# Patient Record
Sex: Female | Born: 1970 | Race: White | Hispanic: No | Marital: Single | Smoking: Never smoker
Health system: Southern US, Community
[De-identification: ages and names within clinical notes are randomized; demographics above are authoritative.]

## PROBLEM LIST (undated history)

## (undated) DIAGNOSIS — D259 Leiomyoma of uterus, unspecified: Secondary | ICD-10-CM

## (undated) DIAGNOSIS — T7840XA Allergy, unspecified, initial encounter: Secondary | ICD-10-CM

## (undated) DIAGNOSIS — J45909 Unspecified asthma, uncomplicated: Secondary | ICD-10-CM

## (undated) HISTORY — PX: HEMORRHOID SURGERY: SHX153

## (undated) HISTORY — DX: Unspecified asthma, uncomplicated: J45.909

## (undated) HISTORY — DX: Allergy, unspecified, initial encounter: T78.40XA

## (undated) HISTORY — PX: TUBAL LIGATION: SHX77

## (undated) HISTORY — PX: EYE SURGERY: SHX253

---

## 1998-07-29 ENCOUNTER — Other Ambulatory Visit: Admission: RE | Admit: 1998-07-29 | Discharge: 1998-07-29 | Payer: Self-pay | Admitting: *Deleted

## 1999-09-30 ENCOUNTER — Other Ambulatory Visit: Admission: RE | Admit: 1999-09-30 | Discharge: 1999-09-30 | Payer: Self-pay | Admitting: *Deleted

## 1999-12-08 ENCOUNTER — Other Ambulatory Visit: Admission: RE | Admit: 1999-12-08 | Discharge: 1999-12-08 | Payer: Self-pay | Admitting: Obstetrics and Gynecology

## 2000-04-06 ENCOUNTER — Other Ambulatory Visit: Admission: RE | Admit: 2000-04-06 | Discharge: 2000-04-06 | Payer: Self-pay | Admitting: Obstetrics and Gynecology

## 2000-08-07 ENCOUNTER — Other Ambulatory Visit: Admission: RE | Admit: 2000-08-07 | Discharge: 2000-08-07 | Payer: Self-pay | Admitting: Obstetrics and Gynecology

## 2000-10-26 ENCOUNTER — Other Ambulatory Visit: Admission: RE | Admit: 2000-10-26 | Discharge: 2000-10-26 | Payer: Self-pay | Admitting: *Deleted

## 2003-07-25 ENCOUNTER — Other Ambulatory Visit: Admission: RE | Admit: 2003-07-25 | Discharge: 2003-07-25 | Payer: Self-pay | Admitting: *Deleted

## 2004-09-06 ENCOUNTER — Encounter: Admission: RE | Admit: 2004-09-06 | Discharge: 2004-09-06 | Payer: Self-pay | Admitting: Orthopedic Surgery

## 2006-04-21 ENCOUNTER — Ambulatory Visit (HOSPITAL_COMMUNITY): Admission: RE | Admit: 2006-04-21 | Discharge: 2006-04-21 | Payer: Self-pay | Admitting: *Deleted

## 2006-07-07 ENCOUNTER — Ambulatory Visit (HOSPITAL_COMMUNITY): Admission: RE | Admit: 2006-07-07 | Discharge: 2006-07-07 | Payer: Self-pay | Admitting: *Deleted

## 2013-03-26 DIAGNOSIS — Z0271 Encounter for disability determination: Secondary | ICD-10-CM

## 2013-04-22 ENCOUNTER — Ambulatory Visit (INDEPENDENT_AMBULATORY_CARE_PROVIDER_SITE_OTHER): Payer: BC Managed Care – PPO | Admitting: Emergency Medicine

## 2013-04-22 ENCOUNTER — Ambulatory Visit: Payer: BC Managed Care – PPO

## 2013-04-22 VITALS — BP 111/64 | HR 75 | Temp 97.8°F | Resp 16 | Ht 65.0 in | Wt 138.2 lb

## 2013-04-22 DIAGNOSIS — R079 Chest pain, unspecified: Secondary | ICD-10-CM

## 2013-04-22 DIAGNOSIS — S139XXA Sprain of joints and ligaments of unspecified parts of neck, initial encounter: Secondary | ICD-10-CM

## 2013-04-22 DIAGNOSIS — Z23 Encounter for immunization: Secondary | ICD-10-CM

## 2013-04-22 DIAGNOSIS — S80811A Abrasion, right lower leg, initial encounter: Secondary | ICD-10-CM

## 2013-04-22 DIAGNOSIS — S20219A Contusion of unspecified front wall of thorax, initial encounter: Secondary | ICD-10-CM

## 2013-04-22 DIAGNOSIS — IMO0002 Reserved for concepts with insufficient information to code with codable children: Secondary | ICD-10-CM

## 2013-04-22 MED ORDER — HYDROCODONE-ACETAMINOPHEN 5-325 MG PO TABS: 1.0000 | ORAL_TABLET | ORAL | Status: DC | PRN

## 2013-04-22 MED ORDER — CYCLOBENZAPRINE HCL 10 MG PO TABS: 10.0000 mg | ORAL_TABLET | Freq: Three times a day (TID) | ORAL | Status: DC | PRN

## 2013-04-22 MED ORDER — NAPROXEN SODIUM 550 MG PO TABS: 550.0000 mg | ORAL_TABLET | Freq: Two times a day (BID) | ORAL | Status: DC
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
## 2013-04-25 ENCOUNTER — Telehealth: Payer: Self-pay

## 2013-04-25 NOTE — Telephone Encounter (Signed)
Printed pts record release. Will mail to pt as email is not available.   bf

## 2013-04-25 NOTE — Telephone Encounter (Signed)
The following email was submitted to your website from [employer] Can I please get a copy of my records for my visit on 04/22/2013?  I will need them for an insurance claim.  You can email (if allowed) or mail them to my home address in the file.    Thank you, [place] [phone]

## 2013-05-13 ENCOUNTER — Telehealth: Payer: Self-pay

## 2013-05-13 NOTE — Telephone Encounter (Signed)
Request given to xray 

## 2013-05-13 NOTE — Telephone Encounter (Signed)
Patient would like to pick up the most recent xrays of her upper body please call [phone] when ready

## 2013-06-17 ENCOUNTER — Other Ambulatory Visit: Payer: Self-pay | Admitting: Orthopedic Surgery

## 2013-06-17 DIAGNOSIS — R0781 Pleurodynia: Secondary | ICD-10-CM

## 2013-06-21 ENCOUNTER — Ambulatory Visit
Admission: RE | Admit: 2013-06-21 | Discharge: 2013-06-21 | Disposition: A | Discharge status: Home / Self Care | Payer: BC Managed Care – PPO | Source: Ambulatory Visit | Attending: Orthopedic Surgery | Admitting: Orthopedic Surgery

## 2013-06-21 DIAGNOSIS — R0781 Pleurodynia: Secondary | ICD-10-CM

## 2014-01-31 ENCOUNTER — Ambulatory Visit (INDEPENDENT_AMBULATORY_CARE_PROVIDER_SITE_OTHER): Payer: BC Managed Care – PPO | Admitting: Physician Assistant

## 2014-01-31 VITALS — BP 132/90 | HR 69 | Temp 97.8°F | Resp 18 | Ht 64.0 in | Wt 141.0 lb

## 2014-01-31 DIAGNOSIS — S61409A Unspecified open wound of unspecified hand, initial encounter: Secondary | ICD-10-CM

## 2014-01-31 DIAGNOSIS — M79642 Pain in left hand: Secondary | ICD-10-CM

## 2014-01-31 DIAGNOSIS — S61412A Laceration without foreign body of left hand, initial encounter: Secondary | ICD-10-CM

## 2014-01-31 DIAGNOSIS — M79609 Pain in unspecified limb: Secondary | ICD-10-CM

## 2014-01-31 NOTE — Progress Notes (Signed)
   Subjective:    Patient ID: Erin Sandoval, female    DOB: Jul 07, 1971, 43 y.o.   MRN: [id number]  HPI   Erin Sandoval is a very pleasant 43 yr old female here after sustaining a laceration to her left hand.  Was cutting trim with a utility knife which slipped and cut the web space between the Left thumb and index finger.  She is Right hand dominant.  She denies numbness, weakness.  She has full ROM.  Minimal pain.  Laceration occurred less than 30 minutes ago.  Last tetanus April 2014   Review of Systems  Respiratory: Negative.   Cardiovascular: Negative.   Musculoskeletal: Positive for arthralgias (left hand).  Skin: Positive for wound.  Neurological: Negative for weakness and numbness.       Objective:   Physical Exam  Vitals reviewed. Constitutional: She is oriented to person, place, and time. She appears well-developed and well-nourished. No distress.  HENT:  Head: Normocephalic and atraumatic.  Eyes: Conjunctivae are normal. No scleral icterus.  Pulmonary/Chest: Effort normal.  Musculoskeletal:       Left hand: She exhibits laceration. She exhibits normal range of motion, normal capillary refill and no swelling. Normal sensation noted. Normal strength noted.       Hands: Approx 1.5cm laceration at web space between left thumb and index finger  Neurological: She is alert and oriented to person, place, and time.  Skin: Skin is warm and dry.  Psychiatric: She has a normal mood and affect. Her behavior is normal.      Procedure Note: Verbal consent obtained from the patient.  Local anesthesia with 2 cc Lidocaine 2% without epinephrine.  Wound scrubbed with soap and water.  Wound explored.  No foreign bodies or deep structure injury noted.  Wound closed with #6 sutures of 5-0 ethilon (#3VM and #3HM.)  Area cleansed and dressed.       Assessment & Plan:  Laceration of hand, left  Pain of left hand   Erin Sandoval is a very pleasant 43 yr old female with laceration of left  hand.  Wound repaired as above.  Anticipate suture removal in 7-10 days.  Tetanus utd.  Discussed wound care and RTC precautions.    Alonza Smoker MHS, PA-C [facility] 2/7/201512:27 PM

## 2014-01-31 NOTE — Patient Instructions (Signed)
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
## 2018-02-08 ENCOUNTER — Encounter (HOSPITAL_COMMUNITY): Payer: Self-pay | Admitting: *Deleted

## 2018-02-26 ENCOUNTER — Other Ambulatory Visit: Payer: Self-pay | Admitting: Obstetrics & Gynecology

## 2018-03-02 NOTE — Patient Instructions (Addendum)
Erin Sandoval  03/02/2018   Your procedure is scheduled on: 03-12-18   Report to [facility] Main  Entrance Report to Admitting at 11:00 AM    Call this number if you have problems the morning of surgery [phone]   Remember: Do not eat food or drink liquids :After Midnight. You may have a Clear Liquid Diet from Midnight until 7:00 AM. After 7:00 AM nothing until after surgery.     CLEAR LIQUID DIET   Foods Allowed                                                                     Foods Excluded  Coffee and tea, regular and decaf                             liquids that you cannot  Plain Jell-O in any flavor                                             see through such as: Fruit ices (not with fruit pulp)                                     milk, soups, orange juice  Iced Popsicles                                    All solid food Carbonated beverages, regular and diet                                    Cranberry, grape and apple juices Sports drinks like Gatorade Lightly seasoned clear broth or consume(fat free) Sugar, honey syrup  Sample Menu Breakfast                                Lunch                                     Supper Cranberry juice                    Beef broth                            Chicken broth Jell-O                                     Grape juice                           Apple juice  Coffee or tea                        Jell-O                                      Popsicle                                                Coffee or tea                        Coffee or tea  _____________________________________________________________________     Take these medicines the morning of surgery with A SIP OF WATER: None                                 You may not have any metal on your body including hair pins and              piercings  Do not wear jewelry, make-up, lotions, powders or perfumes, deodorant             Do not wear  nail polish.  Do not shave  48 hours prior to surgery.                Do not bring valuables to the hospital. [place].  Contacts, dentures or bridgework may not be worn into surgery.  Leave suitcase in the car. After surgery it may be brought to your room.                  Please read over the following fact sheets you were given: _____________________________________________________________________            [facility] - Preparing for Surgery Before surgery, you can play an important role.  Because skin is not sterile, your skin needs to be as free of germs as possible.  You can reduce the number of germs on your skin by washing with CHG (chlorahexidine gluconate) soap before surgery.  CHG is an antiseptic cleaner which kills germs and bonds with the skin to continue killing germs even after washing. Please DO NOT use if you have an allergy to CHG or antibacterial soaps.  If your skin becomes reddened/irritated stop using the CHG and inform your nurse when you arrive at Short Stay. Do not shave (including legs and underarms) for at least 48 hours prior to the first CHG shower.  You may shave your face/neck. Please follow these instructions carefully:  1.  Shower with CHG Soap the night before surgery and the  morning of Surgery.  2.  If you choose to wash your hair, wash your hair first as usual with your  normal  shampoo.  3.  After you shampoo, rinse your hair and body thoroughly to remove the  shampoo.                           4.  Use CHG as you would any other liquid soap.  You can apply chg directly  to the skin and wash                       Gently with a scrungie or clean washcloth.  5.  Apply the CHG Soap to your body ONLY FROM THE NECK DOWN.   Do not use on face/ open                           Wound or open sores. Avoid contact with eyes, ears mouth and genitals (private parts).                       Wash face,  Genitals  (private parts) with your normal soap.             6.  Wash thoroughly, paying special attention to the area where your surgery  will be performed.  7.  Thoroughly rinse your body with warm water from the neck down.  8.  DO NOT shower/wash with your normal soap after using and rinsing off  the CHG Soap.                9.  Pat yourself dry with a clean towel.            10.  Wear clean pajamas.            11.  Place clean sheets on your bed the night of your first shower and do not  sleep with pets. Day of Surgery : Do not apply any lotions/deodorants the morning of surgery.  Please wear clean clothes to the hospital/surgery center.  FAILURE TO FOLLOW THESE INSTRUCTIONS MAY RESULT IN THE CANCELLATION OF YOUR SURGERY PATIENT SIGNATURE_________________________________  NURSE SIGNATURE__________________________________  ________________________________________________________________________   Adam Phenix  An incentive spirometer is a tool that can help keep your lungs clear and active. This tool measures how well you are filling your lungs with each breath. Taking long deep breaths may help reverse or decrease the chance of developing breathing (pulmonary) problems (especially infection) following:  A long period of time when you are unable to move or be active. BEFORE THE PROCEDURE   If the spirometer includes an indicator to show your best effort, your nurse or respiratory therapist will set it to a desired goal.  If possible, sit up straight or lean slightly forward. Try not to slouch.  Hold the incentive spirometer in an upright position. INSTRUCTIONS FOR USE  1. Sit on the edge of your bed if possible, or sit up as far as you can in bed or on a chair. 2. Hold the incentive spirometer in an upright position. 3. Breathe out normally. 4. Place the mouthpiece in your mouth and seal your lips tightly around it. 5. Breathe in slowly and as deeply as possible, raising the  piston or the ball toward the top of the column. 6. Hold your breath for 3-5 seconds or for as long as possible. Allow the piston or ball to fall to the bottom of the column. 7. Remove the mouthpiece from your mouth and breathe out normally. 8. Rest for a few seconds and repeat Steps 1 through 7 at least 10 times every 1-2 hours when you are awake. Take your time and take a few normal breaths between deep breaths. 9. The spirometer may include an indicator to show your best effort. Use the indicator as a goal to work toward during each repetition. 10. After  each set of 10 deep breaths, practice coughing to be sure your lungs are clear. If you have an incision (the cut made at the time of surgery), support your incision when coughing by placing a pillow or rolled up towels firmly against it. Once you are able to get out of bed, walk around indoors and cough well. You may stop using the incentive spirometer when instructed by your caregiver.  RISKS AND COMPLICATIONS  Take your time so you do not get dizzy or light-headed.  If you are in pain, you may need to take or ask for pain medication before doing incentive spirometry. It is harder to take a deep breath if you are having pain. AFTER USE  Rest and breathe slowly and easily.  It can be helpful to keep track of a log of your progress. Your caregiver can provide you with a simple table to help with this. If you are using the spirometer at home, follow these instructions: [place] IF:   You are having difficultly using the spirometer.  You have trouble using the spirometer as often as instructed.  Your pain medication is not giving enough relief while using the spirometer.  You develop fever of 100.5 F (38.1 C) or higher. SEEK IMMEDIATE MEDICAL CARE IF:   You cough up bloody sputum that had not been present before.  You develop fever of 102 F (38.9 C) or greater.  You develop worsening pain at or near the incision  site. MAKE SURE YOU:   Understand these instructions.  Will watch your condition.  Will get help right away if you are not doing well or get worse. Document Released: 04/24/2007 Document Revised: 03/05/2012 Document Reviewed: 06/25/2007 ExitCare Patient Information 2014 Exit[employer], [place].   ________________________________________________________________________  WHAT IS A BLOOD TRANSFUSION? Blood Transfusion Information  A transfusion is the replacement of blood or some of its parts. Blood is made up of multiple cells which provide different functions.  Red blood cells carry oxygen and are used for blood loss replacement.  White blood cells fight against infection.  Platelets control bleeding.  Plasma helps clot blood.  Other blood products are available for specialized needs, such as hemophilia or other clotting disorders. BEFORE THE TRANSFUSION  Who gives blood for transfusions?   Healthy volunteers who are fully evaluated to make sure their blood is safe. This is blood bank blood. Transfusion therapy is the safest it has ever been in the practice of medicine. Before blood is taken from a donor, a complete history is taken to make sure that person has no history of diseases nor engages in risky social behavior (examples are intravenous drug use or sexual activity with multiple partners). The donor's travel history is screened to minimize risk of transmitting infections, such as malaria. The donated blood is tested for signs of infectious diseases, such as HIV and hepatitis. The blood is then tested to be sure it is compatible with you in order to minimize the chance of a transfusion reaction. If you or a relative donates blood, this is often done in anticipation of surgery and is not appropriate for emergency situations. It takes many days to process the donated blood. RISKS AND COMPLICATIONS Although transfusion therapy is very safe and saves many lives, the main dangers of  transfusion include:   Getting an infectious disease.  Developing a transfusion reaction. This is an allergic reaction to something in the blood you were given. Every precaution is taken to prevent this. The decision to  have a blood transfusion has been considered carefully by your caregiver before blood is given. Blood is not given unless the benefits outweigh the risks. AFTER THE TRANSFUSION  Right after receiving a blood transfusion, you will usually feel much better and more energetic. This is especially true if your red blood cells have gotten low (anemic). The transfusion raises the level of the red blood cells which carry oxygen, and this usually causes an energy increase.  The nurse administering the transfusion will monitor you carefully for complications. HOME CARE INSTRUCTIONS  No special instructions are needed after a transfusion. You may find your energy is better. Speak with your caregiver about any limitations on activity for underlying diseases you may have. SEEK MEDICAL CARE IF:   Your condition is not improving after your transfusion.  You develop redness or irritation at the intravenous (IV) site. SEEK IMMEDIATE MEDICAL CARE IF:  Any of the following symptoms occur over the next 12 hours:  Shaking chills.  You have a temperature by mouth above 102 F (38.9 C), not controlled by medicine.  Chest, back, or muscle pain.  People around you feel you are not acting correctly or are confused.  Shortness of breath or difficulty breathing.  Dizziness and fainting.  You get a rash or develop hives.  You have a decrease in urine output.  Your urine turns a dark color or changes to pink, red, or brown. Any of the following symptoms occur over the next 10 days:  You have a temperature by mouth above 102 F (38.9 C), not controlled by medicine.  Shortness of breath.  Weakness after normal activity.  The white part of the eye turns yellow (jaundice).  You have a  decrease in the amount of urine or are urinating less often.  Your urine turns a dark color or changes to pink, red, or brown. Document Released: 12/09/2000 Document Revised: 03/05/2012 Document Reviewed: 07/28/2008 [facility] Patient Information 2014 [place], [place].  _______________________________________________________________________

## 2018-03-05 ENCOUNTER — Encounter (HOSPITAL_COMMUNITY)
Admission: RE | Admit: 2018-03-05 | Discharge: 2018-03-05 | Disposition: A | Discharge status: Home / Self Care | Payer: PRIVATE HEALTH INSURANCE | Source: Ambulatory Visit | Attending: Obstetrics & Gynecology | Admitting: Obstetrics & Gynecology

## 2018-03-05 ENCOUNTER — Other Ambulatory Visit: Payer: Self-pay

## 2018-03-05 ENCOUNTER — Encounter (HOSPITAL_COMMUNITY): Payer: Self-pay

## 2018-03-05 DIAGNOSIS — Z01812 Encounter for preprocedural laboratory examination: Secondary | ICD-10-CM | POA: Diagnosis not present

## 2018-03-05 LAB — PREGNANCY, URINE: PREG TEST UR: NEGATIVE

## 2018-03-05 LAB — ABO/RH: ABO/RH(D): AB POS

## 2018-03-05 NOTE — Progress Notes (Signed)
Pt refused to have CBC and BMP drawn at PAT appointment. Pt stated that it is 'no cost' for her to have  labs drawn at [facility].   Pt advised that if labs are abnormal based on Anesthesia guidelines, labs may have to be redrawn on day of surgery, or surgery could possibly be cancelled. Pt verbalized understanding.... Pt also stated that she would have labs drawn in time for her surgeon to review the results.

## 2018-03-11 NOTE — H&P (Signed)
Erin Sandoval is an 47 y.o. female [phone]. Menorrhagia and pain, slightly enlarged uterus with one submucosal myoma noted since 2017. Menorrhagia not better with NSAIDs. She declined myomectomy/ OCs/ progestion IUD as she was permanent solution for menorrhagia. Says most women in her family needed hysterectomy and she prefers hysterectomy over other options that may have a chance to fail. She wants to keep ovaries at any cost and would prefer she have another surgery should I find any abnormal findings, though nothing is suspected at this point.  Endometrial biopsy was negative in 2017, last sono 2017  C/s x 1, 1 SAB, 1 TAB. Lap tubal ligation.  Anemia  Remote abn Pap hx, recent nl Paps 5 yrs and HPV negative.   Patient's last menstrual period was 02/28/2018 (exact date).    Past Medical History:  Diagnosis Date  . Allergy   . Asthma     Past Surgical History:  Procedure Laterality Date  . CESAREAN SECTION  05/15/1997  . EYE SURGERY     Laser  . HEMORRHOID SURGERY    . TUBAL LIGATION      Family History  Problem Relation Age of Onset  . Cancer Mother   . Diabetes Brother   . Asthma Son     Social History:  reports that  has never smoked. she has never used smokeless tobacco. She reports that she drinks about 1.2 oz of alcohol per week. She reports that she does not use drugs.  Allergies:  Allergies  Allergen Reactions  . Betadine [Povidone Iodine] Other (See Comments)    Burning, irritation  . Darvocet [Propoxyphene N-Acetaminophen] Nausea And Vomiting  . Nickel Other (See Comments)    Unknown    No medications prior to admission.    ROS  Neg   Last menstrual period 02/28/2018. Physical Exam  BP 122/72   Pulse 60   Temp 97.6 F (36.4 C) (Oral)   Resp 16   Ht 5\' 5"  (1.651 m)   Wt 134 lb (60.8 kg)   LMP 02/28/2018 (Exact Date)   SpO2 100%   BMI 22.30 kg/m   LMP 02/28/2018 (Exact Date)  Phy sical exam:  A&O x 3, no acute distress. Pleasant HEENT neg,  no thyromegaly Lungs CTA bilat CV RRR, S1S2 normal Abdo soft, non tender, non acute Extr no edema/ tenderness Pelvic Slightly enlarged uterus normal cervix, no adnexal masses  No results found for this or any previous visit (from the past 24 hour(s)).  No results found.  Assessment/Plan: 47 yo, G3P1021, menorrhagia, anemia. Desires hysterectomy, plan to remove both the remaining fallopian tubes, plan to keep ovaries.  daVinci assisted total laparoscopic hysterectomy, bilateral salpingectomy.  Risks/complications of surgery reviewed incl infection, bleeding, damage to internal organs including bladder, bowels, ureters, blood vessels, other risks from anesthesia, VTE and delayed complications of any surgery, complications in future surgery reviewed.  Elveria Royals

## 2018-03-12 ENCOUNTER — Observation Stay (HOSPITAL_COMMUNITY)
Admission: RE | Admit: 2018-03-12 | Discharge: 2018-03-13 | Disposition: A | Discharge status: Home / Self Care | Payer: PRIVATE HEALTH INSURANCE | Source: Ambulatory Visit | Attending: Obstetrics & Gynecology | Admitting: Obstetrics & Gynecology

## 2018-03-12 ENCOUNTER — Other Ambulatory Visit: Payer: Self-pay

## 2018-03-12 ENCOUNTER — Ambulatory Visit (HOSPITAL_COMMUNITY): Payer: PRIVATE HEALTH INSURANCE | Admitting: Anesthesiology

## 2018-03-12 ENCOUNTER — Encounter (HOSPITAL_COMMUNITY)
Admission: RE | Disposition: A | Discharge status: Home / Self Care | Payer: Self-pay | Source: Ambulatory Visit | Attending: Obstetrics & Gynecology

## 2018-03-12 ENCOUNTER — Encounter (HOSPITAL_COMMUNITY): Payer: Self-pay

## 2018-03-12 DIAGNOSIS — D259 Leiomyoma of uterus, unspecified: Secondary | ICD-10-CM | POA: Diagnosis not present

## 2018-03-12 DIAGNOSIS — D649 Anemia, unspecified: Secondary | ICD-10-CM | POA: Insufficient documentation

## 2018-03-12 DIAGNOSIS — Z90710 Acquired absence of both cervix and uterus: Secondary | ICD-10-CM | POA: Diagnosis present

## 2018-03-12 DIAGNOSIS — N920 Excessive and frequent menstruation with regular cycle: Secondary | ICD-10-CM | POA: Insufficient documentation

## 2018-03-12 HISTORY — PX: ROBOTIC ASSISTED LAPAROSCOPIC HYSTERECTOMY AND SALPINGECTOMY: SHX6379

## 2018-03-12 HISTORY — DX: Leiomyoma of uterus, unspecified: D25.9

## 2018-03-12 LAB — TYPE AND SCREEN
ABO/RH(D): AB POS
ANTIBODY SCREEN: NEGATIVE

## 2018-03-12 SURGERY — XI ROBOTIC ASSISTED LAPAROSCOPIC HYSTERECTOMY AND SALPINGECTOMY
Anesthesia: General | Site: Abdomen | Laterality: Bilateral

## 2018-03-12 MED ORDER — MIDAZOLAM HCL 2 MG/2ML IJ SOLN
INTRAMUSCULAR | Status: DC | PRN
  Administered 2018-03-12: 2 mg via INTRAVENOUS

## 2018-03-12 MED ORDER — GLYCOPYRROLATE 0.2 MG/ML IJ SOLN
INTRAMUSCULAR | Status: DC | PRN
  Administered 2018-03-12: 0.4 mg via INTRAVENOUS

## 2018-03-12 MED ORDER — SCOPOLAMINE 1 MG/3DAYS TD PT72
MEDICATED_PATCH | TRANSDERMAL | Status: AC
  Filled 2018-03-12: qty 1

## 2018-03-12 MED ORDER — PROPOFOL 10 MG/ML IV BOLUS
INTRAVENOUS | Status: DC | PRN
  Administered 2018-03-12: 170 mg via INTRAVENOUS

## 2018-03-12 MED ORDER — KETOROLAC TROMETHAMINE 30 MG/ML IJ SOLN: 30.0000 mg | Freq: Once | INTRAMUSCULAR | Status: AC

## 2018-03-12 MED ORDER — MENTHOL 3 MG MT LOZG
LOZENGE | OROMUCOSAL | Status: AC
  Filled 2018-03-12: qty 9

## 2018-03-12 MED ORDER — FENTANYL CITRATE (PF) 100 MCG/2ML IJ SOLN
INTRAMUSCULAR | Status: DC | PRN
  Administered 2018-03-12: 50 ug via INTRAVENOUS
  Administered 2018-03-12 (×2): 25 ug via INTRAVENOUS

## 2018-03-12 MED ORDER — SUGAMMADEX SODIUM 200 MG/2ML IV SOLN
INTRAVENOUS | Status: DC | PRN
  Administered 2018-03-12: 125 mg via INTRAVENOUS

## 2018-03-12 MED ORDER — OXYCODONE-ACETAMINOPHEN 5-325 MG PO TABS
ORAL_TABLET | ORAL | Status: AC
  Filled 2018-03-12: qty 1

## 2018-03-12 MED ORDER — CEFAZOLIN SODIUM-DEXTROSE 2-4 GM/100ML-% IV SOLN
2.0000 g | INTRAVENOUS | Status: AC
  Administered 2018-03-12: 2 g via INTRAVENOUS
  Filled 2018-03-12: qty 100

## 2018-03-12 MED ORDER — ROPIVACAINE HCL 5 MG/ML IJ SOLN
INTRAMUSCULAR | Status: AC
  Filled 2018-03-12: qty 30

## 2018-03-12 MED ORDER — DEXAMETHASONE SODIUM PHOSPHATE 4 MG/ML IJ SOLN
INTRAMUSCULAR | Status: DC | PRN
  Administered 2018-03-12: 10 mg via INTRAVENOUS

## 2018-03-12 MED ORDER — IBUPROFEN 600 MG PO TABS
600.0000 mg | ORAL_TABLET | Freq: Four times a day (QID) | ORAL | Status: DC | PRN
  Filled 2018-03-12: qty 1

## 2018-03-12 MED ORDER — KETOROLAC TROMETHAMINE 30 MG/ML IJ SOLN
INTRAMUSCULAR | Status: DC | PRN
  Administered 2018-03-12: 30 mg via INTRAVENOUS

## 2018-03-12 MED ORDER — ONDANSETRON HCL 4 MG/2ML IJ SOLN: 4.0000 mg | Freq: Four times a day (QID) | INTRAMUSCULAR | Status: DC | PRN

## 2018-03-12 MED ORDER — ROCURONIUM BROMIDE 100 MG/10ML IV SOLN
INTRAVENOUS | Status: DC | PRN
  Administered 2018-03-12 (×2): 20 mg via INTRAVENOUS
  Administered 2018-03-12: 40 mg via INTRAVENOUS

## 2018-03-12 MED ORDER — DEXAMETHASONE SODIUM PHOSPHATE 10 MG/ML IJ SOLN
INTRAMUSCULAR | Status: AC
  Filled 2018-03-12: qty 1

## 2018-03-12 MED ORDER — LACTATED RINGERS IV SOLN: INTRAVENOUS | Status: DC

## 2018-03-12 MED ORDER — FENTANYL CITRATE (PF) 100 MCG/2ML IJ SOLN
INTRAMUSCULAR | Status: AC
  Filled 2018-03-12: qty 2

## 2018-03-12 MED ORDER — LIDOCAINE 2% (20 MG/ML) 5 ML SYRINGE
INTRAMUSCULAR | Status: AC
  Filled 2018-03-12: qty 5

## 2018-03-12 MED ORDER — KETOROLAC TROMETHAMINE 30 MG/ML IJ SOLN
INTRAMUSCULAR | Status: AC
  Filled 2018-03-12: qty 1

## 2018-03-12 MED ORDER — MENTHOL 3 MG MT LOZG
1.0000 | LOZENGE | OROMUCOSAL | Status: DC | PRN
  Administered 2018-03-12: 3 mg via ORAL
  Filled 2018-03-12: qty 9

## 2018-03-12 MED ORDER — SODIUM CHLORIDE 0.9 % IR SOLN
Status: DC | PRN
  Administered 2018-03-12: 3000 mL

## 2018-03-12 MED ORDER — PROPOFOL 10 MG/ML IV BOLUS
INTRAVENOUS | Status: AC
  Filled 2018-03-12: qty 20

## 2018-03-12 MED ORDER — SUGAMMADEX SODIUM 200 MG/2ML IV SOLN
INTRAVENOUS | Status: AC
  Filled 2018-03-12: qty 2

## 2018-03-12 MED ORDER — ROCURONIUM BROMIDE 10 MG/ML (PF) SYRINGE
PREFILLED_SYRINGE | INTRAVENOUS | Status: AC
  Filled 2018-03-12: qty 5

## 2018-03-12 MED ORDER — FENTANYL CITRATE (PF) 250 MCG/5ML IJ SOLN
INTRAMUSCULAR | Status: AC
  Filled 2018-03-12: qty 5

## 2018-03-12 MED ORDER — FENTANYL CITRATE (PF) 250 MCG/5ML IJ SOLN
INTRAMUSCULAR | Status: DC | PRN
  Administered 2018-03-12 (×2): 50 ug via INTRAVENOUS
  Administered 2018-03-12: 100 ug via INTRAVENOUS
  Administered 2018-03-12: 50 ug via INTRAVENOUS

## 2018-03-12 MED ORDER — ONDANSETRON HCL 4 MG PO TABS
4.0000 mg | ORAL_TABLET | Freq: Four times a day (QID) | ORAL | Status: DC | PRN
  Filled 2018-03-12: qty 1

## 2018-03-12 MED ORDER — ONDANSETRON HCL 4 MG/2ML IJ SOLN
INTRAMUSCULAR | Status: DC | PRN
  Administered 2018-03-12: 4 mg via INTRAVENOUS

## 2018-03-12 MED ORDER — ARTIFICIAL TEARS OPHTHALMIC OINT
TOPICAL_OINTMENT | OPHTHALMIC | Status: AC
  Filled 2018-03-12: qty 3.5

## 2018-03-12 MED ORDER — METOCLOPRAMIDE HCL 5 MG/ML IJ SOLN
5.0000 mg | Freq: Once | INTRAMUSCULAR | Status: AC
  Administered 2018-03-12: 5 mg via INTRAVENOUS

## 2018-03-12 MED ORDER — METOCLOPRAMIDE HCL 5 MG/ML IJ SOLN
INTRAMUSCULAR | Status: AC
Start: 2018-03-12 — End: 2018-03-13
  Filled 2018-03-12: qty 2

## 2018-03-12 MED ORDER — SODIUM CHLORIDE 0.9 % IJ SOLN
INTRAMUSCULAR | Status: AC
  Filled 2018-03-12: qty 50

## 2018-03-12 MED ORDER — ONDANSETRON HCL 4 MG/2ML IJ SOLN
INTRAMUSCULAR | Status: AC
  Filled 2018-03-12: qty 2

## 2018-03-12 MED ORDER — OXYCODONE-ACETAMINOPHEN 5-325 MG PO TABS
1.0000 | ORAL_TABLET | Freq: Four times a day (QID) | ORAL | Status: DC | PRN
  Administered 2018-03-12 – 2018-03-13 (×3): 1 via ORAL

## 2018-03-12 MED ORDER — LACTATED RINGERS IV SOLN
INTRAVENOUS | Status: DC
  Administered 2018-03-12 (×2): via INTRAVENOUS

## 2018-03-12 MED ORDER — EPHEDRINE 5 MG/ML INJ
INTRAVENOUS | Status: AC
  Filled 2018-03-12: qty 10

## 2018-03-12 MED ORDER — SCOPOLAMINE 1 MG/3DAYS TD PT72
1.0000 | MEDICATED_PATCH | Freq: Once | TRANSDERMAL | Status: DC
  Administered 2018-03-12: 1.5 mg via TRANSDERMAL
  Filled 2018-03-12: qty 1

## 2018-03-12 MED ORDER — MIDAZOLAM HCL 2 MG/2ML IJ SOLN
INTRAMUSCULAR | Status: AC
  Filled 2018-03-12: qty 2

## 2018-03-12 MED ORDER — ARTIFICIAL TEARS OPHTHALMIC OINT
TOPICAL_OINTMENT | OPHTHALMIC | Status: DC | PRN
  Administered 2018-03-12: 1 via OPHTHALMIC

## 2018-03-12 MED ORDER — ROPIVACAINE HCL 5 MG/ML IJ SOLN
INTRAMUSCULAR | Status: DC | PRN
  Administered 2018-03-12: 60 mL

## 2018-03-12 MED ORDER — GLYCOPYRROLATE 0.2 MG/ML IV SOSY
PREFILLED_SYRINGE | INTRAVENOUS | Status: AC
  Filled 2018-03-12: qty 3

## 2018-03-12 MED ORDER — LIDOCAINE HCL (CARDIAC) 20 MG/ML IV SOLN
INTRAVENOUS | Status: DC | PRN
  Administered 2018-03-12: 70 mg via INTRAVENOUS

## 2018-03-12 MED ORDER — EPHEDRINE SULFATE-NACL 50-0.9 MG/10ML-% IV SOSY
PREFILLED_SYRINGE | INTRAVENOUS | Status: DC | PRN
  Administered 2018-03-12: 5 mg via INTRAVENOUS
  Administered 2018-03-12: 10 mg via INTRAVENOUS

## 2018-03-12 SURGICAL SUPPLY — 51 items
BARRIER ADHS 3X4 INTERCEED (GAUZE/BANDAGES/DRESSINGS) IMPLANT
CATH FOLEY 3WAY  5CC 16FR (CATHETERS) ×2
CATH FOLEY 3WAY 5CC 16FR (CATHETERS) ×1 IMPLANT
COVER BACK TABLE 60X90IN (DRAPES) ×6 IMPLANT
COVER TIP SHEARS 8 DVNC (MISCELLANEOUS) ×1 IMPLANT
COVER TIP SHEARS 8MM DA VINCI (MISCELLANEOUS) ×2
DECANTER SPIKE VIAL GLASS SM (MISCELLANEOUS) ×6 IMPLANT
DEFOGGER SCOPE WARMER CLEARIFY (MISCELLANEOUS) ×3 IMPLANT
DERMABOND ADVANCED (GAUZE/BANDAGES/DRESSINGS) ×2
DERMABOND ADVANCED .7 DNX12 (GAUZE/BANDAGES/DRESSINGS) ×1 IMPLANT
DRAPE ARM DVNC X/XI (DISPOSABLE) ×4 IMPLANT
DRAPE COLUMN DVNC XI (DISPOSABLE) ×1 IMPLANT
DRAPE DA VINCI XI ARM (DISPOSABLE) ×8
DRAPE DA VINCI XI COLUMN (DISPOSABLE) ×2
DURAPREP 26ML APPLICATOR (WOUND CARE) ×3 IMPLANT
ELECT REM PT RETURN 15FT ADLT (MISCELLANEOUS) ×3 IMPLANT
ELECT REM PT RETURN 9FT ADLT (ELECTROSURGICAL) ×3
ELECTRODE REM PT RTRN 9FT ADLT (ELECTROSURGICAL) ×1 IMPLANT
GLOVE BIO SURGEON STRL SZ7 (GLOVE) ×9 IMPLANT
GLOVE BIOGEL PI IND STRL 7.0 (GLOVE) ×5 IMPLANT
GLOVE BIOGEL PI INDICATOR 7.0 (GLOVE) ×10
IRRIG SUCT STRYKERFLOW 2 WTIP (MISCELLANEOUS) ×3
IRRIGATION SUCT STRKRFLW 2 WTP (MISCELLANEOUS) ×1 IMPLANT
OBTURATOR OPTICAL STANDARD 8MM (TROCAR)
OBTURATOR OPTICAL STND 8 DVNC (TROCAR)
OBTURATOR OPTICALSTD 8 DVNC (TROCAR) IMPLANT
OCCLUDER COLPOPNEUMO (BALLOONS) ×3 IMPLANT
PACK ROBOT WH (CUSTOM PROCEDURE TRAY) ×3 IMPLANT
PACK ROBOTIC GOWN (GOWN DISPOSABLE) ×3 IMPLANT
PACK TRENDGUARD 450 HYBRID PRO (MISCELLANEOUS) ×1 IMPLANT
PACK TRENDGUARD 600 HYBRD PROC (MISCELLANEOUS) IMPLANT
PAD PREP 24X48 CUFFED NSTRL (MISCELLANEOUS) ×3 IMPLANT
POUCH LAPAROSCOPIC INSTRUMENT (MISCELLANEOUS) ×3 IMPLANT
SEAL CANN UNIV 5-8 DVNC XI (MISCELLANEOUS) ×3 IMPLANT
SEAL XI 5MM-8MM UNIVERSAL (MISCELLANEOUS) ×6
SET CYSTO W/LG BORE CLAMP LF (SET/KITS/TRAYS/PACK) IMPLANT
SET TRI-LUMEN FLTR TB AIRSEAL (TUBING) ×3 IMPLANT
SUT VICRYL 0 UR6 27IN ABS (SUTURE) ×3 IMPLANT
SUT VICRYL 4-0 PS2 18IN ABS (SUTURE) ×6 IMPLANT
SUT VLOC 180 0 9IN  GS21 (SUTURE) ×2
SUT VLOC 180 0 9IN GS21 (SUTURE) ×1 IMPLANT
TIP RUMI ORANGE 6.7MMX12CM (TIP) IMPLANT
TIP UTERINE 5.1X6CM LAV DISP (MISCELLANEOUS) IMPLANT
TIP UTERINE 6.7X10CM GRN DISP (MISCELLANEOUS) IMPLANT
TIP UTERINE 6.7X6CM WHT DISP (MISCELLANEOUS) IMPLANT
TIP UTERINE 6.7X8CM BLUE DISP (MISCELLANEOUS) ×3 IMPLANT
TOWEL OR 17X24 6PK STRL BLUE (TOWEL DISPOSABLE) ×9 IMPLANT
TRENDGUARD 450 HYBRID PRO PACK (MISCELLANEOUS) ×3
TRENDGUARD 600 HYBRID PROC PK (MISCELLANEOUS)
TROCAR PORT AIRSEAL 5X120 (TROCAR) ×3 IMPLANT
WATER STERILE IRR 1000ML POUR (IV SOLUTION) ×3 IMPLANT

## 2018-03-12 NOTE — Op Note (Signed)
Preoperative diagnosis:  Uterine fibroid, menorrhagia  Postop diagnosis: Same Procedure: da Vinci robot assisted total laparoscopic hysterectomy and bilateral salpingectomy Anesthesia Gen. Endotracheal Surgeon: Dr. Azucena Fallen Assistant: Derrell Lolling, CNM  IV fluids: 1000 cc LR EBL: 30 cc Urine output: 607 cc, clear Complications: none Pathology: Uterus with cervix and both fallopian tubes Disposition: PACU, stable Findings: Slightly enlarged uterus with a fibroid. Normal ovaries. Normal fallopian tubes with evidence of prior sterilization. Normal appearing liver surface. Normal gross appearance of bowel and omentum  Procedure:  Indication: -- Menorrhagia, anemia, dysmenorrhea, uterine fibroid. Management options reviewed and risks/ complications of all, patient desired to have hysterectomy to have a permanent solution for her menorrhagia.  Complications of surgery including infection, bleeding, damage to internal organs and other surgery related problems including pneumonia, VTE reviewed and informed written consent was obtained.  Patient was brought to the operating room with IV running. She received 2 gm Ancef . Underwent general anesthesia without difficulty and was given dorsal lithotomy position, prepped and draped in sterile fashion. Foley catheter was placed. Cervix was exposed with a speculum and anterior lip of the cervix was grasped with tenaculum. Uterus was sounded to 8 cm. A  # 8 Rumi tip and a small Koh ring was assembled on the [employer] and entered in the uterine cavity and balloon was inflated to secure it in place. Koh ring was palpated again cervico-vaginal junction. Speculum was removed, tenaculum was left on the cervix.   Attention was focused on abdomen. Supraumbilical 10 mm vertical incision made with scalpel after injecting Ropivacaine, fascia dissected, grasped with Kocher's and incised, posterior rectus sheath and peritoneum grasped, incised, intraabdominal  entry confirmed. Purse string stay stitch on 0-Vicryl taken on fascia and Hassan cannula introduced and Vicryl sutures secured on the Hassan cannula. Pneumoperitoneum was begun. Laparoscope was introduced and the peritoneal cavity was evaluated. No bleeding at the entry site, no adhesions except some anterior bladder peritoneal adhesions noted. Trendelenburg position given.   Port sited marked. One Robotic cannula inserted on right side and one on the left side under vision. 3rd port was air-seal port for assistant and insufflation. Robot was docked from patient's right. Instruments inserted, Scissors from right and fenestrated bipolar grasper from the left and attached robot arms and energy cables attached.  Dr. Benjie Karvonen scrubbed out and went for surgical console.  Uterus was just slight enlarged. Both the ovaries, tubes and ureters evaluated, appeared normal.  Uterus was deviated. Right salpingectomy performed and tube handed off. Now left salpingectomy performed and tube handed off. Right round ligament desiccated and cut and then right utero-ovarian pedicle desiccated and cut. Anterior and posterior borad ligament dissected while cauterizing small vessels. Anterior bladder dissection done carefully from the right upto the midline. Left round ligament desiccated and cut and then left utero-ovarian pedicle desiccated and cut. Broad ligament dissected anteriorly to meet the bladder flap. Bladder flap lifted and bladder dissection done until Koh ring impression was seen well. Posteriorly broad ligament dissected and uterine vessels were exposed bilaterally. Right uterine vessels were desiccated and cut. Uterus was deviated to the right and the left uterine vessels were desiccated and cut. Uterus was noted to have bluish hue. Vaginal occluder was inflated. Colpotomy was begun starting from midline anteriorly coming to the left and right and then circumferentially staying above the uterosacral ligaments posteriorly.   Uterus, cervix were pulled out of the vaginal opening and uterus was placed back in vagina to maintain pneumoperitoneum.  Vaginal cut edges  were evaluated for hemostasis. Bleeding from right angle was desiccated. Now hemostasis was noted.  Irrigation was performed pedicles appeared dry. Robotic instruments switched for needle driver on the right and continues to have fenestrated grasper on the left. 0-v-lock used to close vaginal cuff starting from right to the left and returning to midline.  Irrigation was performed, all pedicles and cuff appeared to be hemostatic.   Robotic instruments were removed. Dr Benjie Karvonen scrubbed back. Robot was de-docked. Patient was made supine. Lap'scope was reintroduced, hemostasis was excellent. Liver surface appeared normal.  Robotic cannulas were removed under vision and pneumoperitoneum released. Laparoscope and central port removed under vision. The stay sutures at the fascia tied together with excellent fascial closure. Skin approximated with subcuticular stitches on 4-0 Vicryl at all sites. Dermabond was applied.  Specimen handed off.  No vaginal bleeding noted on vaginal exam at the end. All instruments/lap/sponges counts were correct x2.  No complications. Patient tolerated procedure well and was reversed from anesthesia and brought to the PACU stable condition. Foley catheter to be removed on the floor where she'll be admitted overnight for observation.   Dr Benjie Karvonen was the surgeon for entire case.   --V.Benjie Karvonen, MD

## 2018-03-12 NOTE — Anesthesia Postprocedure Evaluation (Signed)
Anesthesia Post Note  Patient: Erin Sandoval  Procedure(s) Performed: XI ROBOTIC ASSISTED LAPAROSCOPIC HYSTERECTOMY AND SALPINGECTOMY (canceled)     Patient location during evaluation: PACU Anesthesia Type: General Level of consciousness: awake and alert Pain management: pain level controlled Vital Signs Assessment: post-procedure vital signs reviewed and stable Respiratory status: spontaneous breathing, nonlabored ventilation and respiratory function stable Cardiovascular status: blood pressure returned to baseline and stable Postop Assessment: no apparent nausea or vomiting Anesthetic complications: no    Last Vitals:  Vitals:   03/12/18 1803 03/12/18 1900  BP: 127/61 121/63  Pulse:    Resp: 14 14  Temp: 36.4 C 36.6 C  SpO2: 100% 100%    Last Pain:  Vitals:   03/12/18 1915  TempSrc:   PainSc: 0-No pain                 Audry Pili

## 2018-03-12 NOTE — Transfer of Care (Signed)
Immediate Anesthesia Transfer of Care Note  Patient: Erin Sandoval  Procedure(s) Performed: XI ROBOTIC ASSISTED LAPAROSCOPIC HYSTERECTOMY AND SALPINGECTOMY (Bilateral Abdomen)  Patient Location: PACU  Anesthesia Type:General  Level of Consciousness: awake, alert , oriented and patient cooperative  Airway & Oxygen Therapy: Patient Spontanous Breathing and Patient connected to face mask oxygen  Post-op Assessment: Report given to RN and Post -op Vital signs reviewed and stable  Post vital signs: Reviewed and stable  Last Vitals:  Vitals:   03/12/18 1102  BP: 122/72  Pulse: 60  Resp: 16  Temp: 36.4 C  SpO2: 100%    Last Pain:  Vitals:   03/12/18 1102  TempSrc: Oral      Patients Stated Pain Goal: 4 (07/68/08 8110)  Complications: No apparent anesthesia complications

## 2018-03-12 NOTE — Anesthesia Preprocedure Evaluation (Addendum)
Anesthesia Evaluation  Patient identified by MRN, date of birth, ID band Patient awake    Reviewed: Allergy & Precautions, NPO status , Patient's Chart, lab work & pertinent test results  Airway Mallampati: II  TM Distance: >3 FB Neck ROM: Full    Dental  (+) Dental Advisory Given   Pulmonary asthma ,    Pulmonary exam normal breath sounds clear to auscultation       Cardiovascular negative cardio ROS Normal cardiovascular exam Rhythm:Regular Rate:Normal     Neuro/Psych negative neurological ROS  negative psych ROS   GI/Hepatic negative GI ROS, Neg liver ROS,   Endo/Other  negative endocrine ROS  Renal/GU negative Renal ROS  negative genitourinary   Musculoskeletal negative musculoskeletal ROS (+)   Abdominal   Peds  Hematology negative hematology ROS (+)   Anesthesia Other Findings   Reproductive/Obstetrics Menorrhagia                            Anesthesia Physical Anesthesia Plan  ASA: II  Anesthesia Plan: General   Post-op Pain Management:    Induction: Intravenous  PONV Risk Score and Plan: 4 or greater and Treatment may vary due to age or medical condition, Ondansetron, Dexamethasone, Midazolam and Scopolamine patch - Pre-op  Airway Management Planned: Oral ETT  Additional Equipment: None  Intra-op Plan:   Post-operative Plan: Extubation in OR  Informed Consent: I have reviewed the patients History and Physical, chart, labs and discussed the procedure including the risks, benefits and alternatives for the proposed anesthesia with the patient or authorized representative who has indicated his/her understanding and acceptance.   Dental advisory given  Plan Discussed with: CRNA and Anesthesiologist  Anesthesia Plan Comments:         Anesthesia Quick Evaluation

## 2018-03-12 NOTE — Anesthesia Procedure Notes (Signed)
Procedure Name: Intubation Date/Time: 03/12/2018 1:21 PM Performed by: Raenette Rover, CRNA Pre-anesthesia Checklist: Patient identified, Emergency Drugs available, Suction available and Patient being monitored Patient Re-evaluated:Patient Re-evaluated prior to induction Oxygen Delivery Method: Circle system utilized Preoxygenation: Pre-oxygenation with 100% oxygen Induction Type: IV induction Ventilation: Mask ventilation without difficulty Laryngoscope Size: Mac and 3 Grade View: Grade I Tube type: Oral Tube size: 7.0 mm Number of attempts: 1 Airway Equipment and Method: Stylet Placement Confirmation: ETT inserted through vocal cords under direct vision,  positive ETCO2,  CO2 detector and breath sounds checked- equal and bilateral Secured at: 21 cm Tube secured with: Tape Dental Injury: Teeth and Oropharynx as per pre-operative assessment

## 2018-03-12 NOTE — Progress Notes (Signed)
Dr. Radene Knee at bedside. Stated to call her if needed tonight, NOT the office number. Dr. Harriet Pho number is: [phone].

## 2018-03-13 DIAGNOSIS — D259 Leiomyoma of uterus, unspecified: Secondary | ICD-10-CM | POA: Diagnosis not present

## 2018-03-13 MED ORDER — OXYCODONE-ACETAMINOPHEN 5-325 MG PO TABS
ORAL_TABLET | ORAL | Status: AC
  Filled 2018-03-13: qty 1

## 2018-03-13 NOTE — Discharge Instructions (Signed)

## 2018-03-13 NOTE — Progress Notes (Signed)
1 Day Post-Op Procedure(s) (LRB): XI ROBOTIC ASSISTED LAPAROSCOPIC HYSTERECTOMY AND SALPINGECTOMY (Bilateral)  Subjective: Patient reports + flatus, + BM and no problems voiding. Tolerating PO regular diet, pain well controlled    Objective: I have reviewed patient's vital signs, intake and output and medications. BP 112/65 (BP Location: Right Arm)   Pulse 62   Temp 98.6 F (37 C) (Oral)   Resp 16   Ht 5\' 5"  (1.651 m)   Wt 134 lb (60.8 kg)   LMP 02/28/2018 (Exact Date)   SpO2 100%   BMI 22.30 kg/m   Intake/Output Summary (Last 24 hours) at 03/13/2018 0644 Last data filed at 03/13/2018 0300 Gross per 24 hour  Intake 2100 ml  Output 3430 ml  Net -1330 ml   General: alert and cooperative Resp: clear to auscultation bilaterally Cardio: regular rate and rhythm, S1, S2 normal, no murmur, click, rub or gallop GI: soft, non-tender; bowel sounds normal; no masses,  no organomegaly Extremities: extremities normal, atraumatic, no cyanosis or edema Incisions clean dry  Assessment: s/p Procedure(s): XI ROBOTIC ASSISTED LAPAROSCOPIC HYSTERECTOMY AND SALPINGECTOMY (Bilateral): stable, progressing well and doing well   Plan: Discharge home, post-op care, warning s/s reviewed F/up office 2 weeks   LOS: 0 days    Erin Sandoval 03/13/2018, 6:43 AM

## 2018-03-13 NOTE — Discharge Summary (Signed)
Physician Discharge Summary   Patient ID: Erin Sandoval [id number] y.o. 1971-08-16  Admit date: 03/12/2018 Uterine fibroids with menorrhagia   Discharge date and time: 03/13/2018 11:17 AM   Admitting Physician: Azucena Fallen, MD   Discharge Physician: Kathyrn Drown, MD  Admission Diagnoses: Menorrhagia, Uterine Fibroid  Discharge Diagnoses: Same, s/p XiRobot assisted total laparoscopic hysterectomy, bilateral salpingectomy  Admission Condition: good  Discharged Condition: good  Hospital Course: Uncomplicated surgery and overnight post-op stay. Patient tolerated ambulating, ate regular diet, voiding well and pain was well controlled. Exam was normal and post-op mild tenderness and crepitus on back and side chest wall. Normal bowel sounds. No vaginal bleeding. She was discharged home with a friend.  Post-op care reviewed.   Disposition:  Home   Patient Instructions:  Allergies as of 03/13/2018      Reactions   Betadine [povidone Iodine] Other (See Comments)   Burning, irritation   Darvocet [propoxyphene N-acetaminophen] Nausea And Vomiting   Nickel Other (See Comments)   Unknown      Medication List    You have not been prescribed any medications.    Activity: activity as tolerated, no lifting, driving, or strenuous exercise for 6, no driving for 2 weeks and no sex for 6 weeks Diet: regular diet Wound Care: none needed  Follow-up with 2 in weeks with Dr Benjie Karvonen .  SignedElveria Royals 03/13/2018 4:06 PM

## 2018-03-14 ENCOUNTER — Encounter (HOSPITAL_COMMUNITY): Payer: Self-pay | Admitting: Obstetrics & Gynecology

## 2018-06-06 ENCOUNTER — Other Ambulatory Visit: Payer: Self-pay | Admitting: Obstetrics & Gynecology

## 2019-09-11 DIAGNOSIS — Z1159 Encounter for screening for other viral diseases: Secondary | ICD-10-CM | POA: Diagnosis not present

## 2019-09-11 DIAGNOSIS — Z118 Encounter for screening for other infectious and parasitic diseases: Secondary | ICD-10-CM | POA: Diagnosis not present

## 2019-09-11 DIAGNOSIS — Z6821 Body mass index (BMI) 21.0-21.9, adult: Secondary | ICD-10-CM | POA: Diagnosis not present

## 2019-09-11 DIAGNOSIS — Z1231 Encounter for screening mammogram for malignant neoplasm of breast: Secondary | ICD-10-CM | POA: Diagnosis not present

## 2019-09-11 DIAGNOSIS — Z113 Encounter for screening for infections with a predominantly sexual mode of transmission: Secondary | ICD-10-CM | POA: Diagnosis not present

## 2019-09-11 DIAGNOSIS — Z114 Encounter for screening for human immunodeficiency virus [HIV]: Secondary | ICD-10-CM | POA: Diagnosis not present

## 2019-09-11 DIAGNOSIS — Z01419 Encounter for gynecological examination (general) (routine) without abnormal findings: Secondary | ICD-10-CM | POA: Diagnosis not present

## 2020-02-17 DIAGNOSIS — Z113 Encounter for screening for infections with a predominantly sexual mode of transmission: Secondary | ICD-10-CM | POA: Diagnosis not present

## 2020-06-25 DIAGNOSIS — Z20822 Contact with and (suspected) exposure to covid-19: Secondary | ICD-10-CM | POA: Diagnosis not present

## 2020-11-26 DIAGNOSIS — Z20828 Contact with and (suspected) exposure to other viral communicable diseases: Secondary | ICD-10-CM | POA: Diagnosis not present
